# Patient Record
Sex: Male | Born: 1955 | Race: White | Hispanic: No | Marital: Married | Smoking: Never smoker
Health system: Southern US, Community
[De-identification: ages and names within clinical notes are randomized; demographics above are authoritative.]

## PROBLEM LIST (undated history)

## (undated) DIAGNOSIS — N400 Enlarged prostate without lower urinary tract symptoms: Secondary | ICD-10-CM

## (undated) DIAGNOSIS — I10 Essential (primary) hypertension: Secondary | ICD-10-CM

## (undated) DIAGNOSIS — F329 Major depressive disorder, single episode, unspecified: Secondary | ICD-10-CM

## (undated) DIAGNOSIS — E119 Type 2 diabetes mellitus without complications: Secondary | ICD-10-CM

## (undated) DIAGNOSIS — E785 Hyperlipidemia, unspecified: Secondary | ICD-10-CM

## (undated) DIAGNOSIS — R569 Unspecified convulsions: Secondary | ICD-10-CM

## (undated) DIAGNOSIS — G4730 Sleep apnea, unspecified: Secondary | ICD-10-CM

## (undated) DIAGNOSIS — K219 Gastro-esophageal reflux disease without esophagitis: Secondary | ICD-10-CM

## (undated) DIAGNOSIS — F32A Depression, unspecified: Secondary | ICD-10-CM

## (undated) DIAGNOSIS — M1990 Unspecified osteoarthritis, unspecified site: Secondary | ICD-10-CM

## (undated) HISTORY — PX: COLONOSCOPY: SHX174

## (undated) HISTORY — PX: ANKLE RECONSTRUCTION: SHX1151

---

## 2017-06-17 ENCOUNTER — Other Ambulatory Visit: Payer: Self-pay

## 2017-06-17 ENCOUNTER — Encounter (HOSPITAL_BASED_OUTPATIENT_CLINIC_OR_DEPARTMENT_OTHER): Payer: Self-pay | Admitting: *Deleted

## 2017-06-18 ENCOUNTER — Encounter (HOSPITAL_BASED_OUTPATIENT_CLINIC_OR_DEPARTMENT_OTHER)
Admission: RE | Admit: 2017-06-18 | Discharge: 2017-06-18 | Disposition: A | Discharge status: Home / Self Care | Payer: Worker's Compensation | Source: Ambulatory Visit | Attending: Orthopedic Surgery | Admitting: Orthopedic Surgery

## 2017-06-18 DIAGNOSIS — Z01810 Encounter for preprocedural cardiovascular examination: Secondary | ICD-10-CM | POA: Insufficient documentation

## 2017-06-18 DIAGNOSIS — G4730 Sleep apnea, unspecified: Secondary | ICD-10-CM | POA: Diagnosis not present

## 2017-06-18 DIAGNOSIS — R569 Unspecified convulsions: Secondary | ICD-10-CM | POA: Diagnosis not present

## 2017-06-18 DIAGNOSIS — Z01812 Encounter for preprocedural laboratory examination: Secondary | ICD-10-CM | POA: Diagnosis not present

## 2017-06-18 DIAGNOSIS — M25571 Pain in right ankle and joints of right foot: Secondary | ICD-10-CM | POA: Diagnosis present

## 2017-06-18 DIAGNOSIS — I10 Essential (primary) hypertension: Secondary | ICD-10-CM | POA: Diagnosis not present

## 2017-06-18 DIAGNOSIS — M19071 Primary osteoarthritis, right ankle and foot: Secondary | ICD-10-CM | POA: Diagnosis not present

## 2017-06-18 DIAGNOSIS — N400 Enlarged prostate without lower urinary tract symptoms: Secondary | ICD-10-CM | POA: Diagnosis not present

## 2017-06-18 DIAGNOSIS — F329 Major depressive disorder, single episode, unspecified: Secondary | ICD-10-CM | POA: Diagnosis not present

## 2017-06-18 DIAGNOSIS — E785 Hyperlipidemia, unspecified: Secondary | ICD-10-CM | POA: Diagnosis not present

## 2017-06-18 DIAGNOSIS — Z79899 Other long term (current) drug therapy: Secondary | ICD-10-CM | POA: Diagnosis not present

## 2017-06-18 DIAGNOSIS — E119 Type 2 diabetes mellitus without complications: Secondary | ICD-10-CM | POA: Diagnosis not present

## 2017-06-18 DIAGNOSIS — Z7984 Long term (current) use of oral hypoglycemic drugs: Secondary | ICD-10-CM | POA: Diagnosis not present

## 2017-06-18 DIAGNOSIS — M659 Synovitis and tenosynovitis, unspecified: Secondary | ICD-10-CM | POA: Diagnosis not present

## 2017-06-18 DIAGNOSIS — K219 Gastro-esophageal reflux disease without esophagitis: Secondary | ICD-10-CM | POA: Diagnosis not present

## 2017-06-18 LAB — BASIC METABOLIC PANEL
ANION GAP: 9 (ref 5–15)
BUN: 14 mg/dL (ref 6–20)
CALCIUM: 8.9 mg/dL (ref 8.9–10.3)
CO2: 25 mmol/L (ref 22–32)
Chloride: 106 mmol/L (ref 101–111)
Creatinine, Ser: 0.72 mg/dL (ref 0.61–1.24)
GFR calc Af Amer: >60 mL/min (ref >60)
GLUCOSE: 98 mg/dL (ref 65–99)
POTASSIUM: 4.2 mmol/L (ref 3.5–5.1)
SODIUM: 140 mmol/L (ref 135–145)

## 2017-06-18 LAB — SURGICAL PCR SCREEN
MRSA, PCR: NEGATIVE
STAPHYLOCOCCUS AUREUS: NEGATIVE

## 2017-06-18 NOTE — Progress Notes (Signed)
Pt  notified PCR- negative.

## 2017-06-19 ENCOUNTER — Other Ambulatory Visit: Payer: Self-pay | Admitting: Orthopedic Surgery

## 2017-06-27 ENCOUNTER — Other Ambulatory Visit: Payer: Self-pay

## 2017-06-27 ENCOUNTER — Ambulatory Visit (HOSPITAL_BASED_OUTPATIENT_CLINIC_OR_DEPARTMENT_OTHER): Payer: Worker's Compensation | Admitting: Certified Registered"

## 2017-06-27 ENCOUNTER — Ambulatory Visit (HOSPITAL_COMMUNITY): Payer: Worker's Compensation

## 2017-06-27 ENCOUNTER — Encounter (HOSPITAL_BASED_OUTPATIENT_CLINIC_OR_DEPARTMENT_OTHER)
Admission: RE | Disposition: A | Discharge status: Home / Self Care | Payer: Self-pay | Source: Ambulatory Visit | Attending: Orthopedic Surgery

## 2017-06-27 ENCOUNTER — Ambulatory Visit (HOSPITAL_BASED_OUTPATIENT_CLINIC_OR_DEPARTMENT_OTHER)
Admission: RE | Admit: 2017-06-27 | Discharge: 2017-06-28 | Disposition: A | Discharge status: Home / Self Care | Payer: Worker's Compensation | Source: Ambulatory Visit | Attending: Orthopedic Surgery | Admitting: Orthopedic Surgery

## 2017-06-27 ENCOUNTER — Encounter (HOSPITAL_BASED_OUTPATIENT_CLINIC_OR_DEPARTMENT_OTHER): Payer: Self-pay

## 2017-06-27 DIAGNOSIS — E119 Type 2 diabetes mellitus without complications: Secondary | ICD-10-CM | POA: Insufficient documentation

## 2017-06-27 DIAGNOSIS — M19071 Primary osteoarthritis, right ankle and foot: Secondary | ICD-10-CM | POA: Insufficient documentation

## 2017-06-27 DIAGNOSIS — M659 Synovitis and tenosynovitis, unspecified: Secondary | ICD-10-CM | POA: Insufficient documentation

## 2017-06-27 DIAGNOSIS — F329 Major depressive disorder, single episode, unspecified: Secondary | ICD-10-CM | POA: Insufficient documentation

## 2017-06-27 DIAGNOSIS — I10 Essential (primary) hypertension: Secondary | ICD-10-CM | POA: Insufficient documentation

## 2017-06-27 DIAGNOSIS — G4730 Sleep apnea, unspecified: Secondary | ICD-10-CM | POA: Insufficient documentation

## 2017-06-27 DIAGNOSIS — N400 Enlarged prostate without lower urinary tract symptoms: Secondary | ICD-10-CM | POA: Insufficient documentation

## 2017-06-27 DIAGNOSIS — Z79899 Other long term (current) drug therapy: Secondary | ICD-10-CM | POA: Insufficient documentation

## 2017-06-27 DIAGNOSIS — R569 Unspecified convulsions: Secondary | ICD-10-CM | POA: Insufficient documentation

## 2017-06-27 DIAGNOSIS — Z7984 Long term (current) use of oral hypoglycemic drugs: Secondary | ICD-10-CM | POA: Insufficient documentation

## 2017-06-27 DIAGNOSIS — K219 Gastro-esophageal reflux disease without esophagitis: Secondary | ICD-10-CM | POA: Insufficient documentation

## 2017-06-27 DIAGNOSIS — E785 Hyperlipidemia, unspecified: Secondary | ICD-10-CM | POA: Insufficient documentation

## 2017-06-27 DIAGNOSIS — M19171 Post-traumatic osteoarthritis, right ankle and foot: Secondary | ICD-10-CM | POA: Diagnosis present

## 2017-06-27 HISTORY — DX: Hyperlipidemia, unspecified: E78.5

## 2017-06-27 HISTORY — DX: Depression, unspecified: F32.A

## 2017-06-27 HISTORY — DX: Gastro-esophageal reflux disease without esophagitis: K21.9

## 2017-06-27 HISTORY — DX: Type 2 diabetes mellitus without complications: E11.9

## 2017-06-27 HISTORY — DX: Major depressive disorder, single episode, unspecified: F32.9

## 2017-06-27 HISTORY — DX: Benign prostatic hyperplasia without lower urinary tract symptoms: N40.0

## 2017-06-27 HISTORY — DX: Essential (primary) hypertension: I10

## 2017-06-27 HISTORY — DX: Unspecified osteoarthritis, unspecified site: M19.90

## 2017-06-27 HISTORY — DX: Sleep apnea, unspecified: G47.30

## 2017-06-27 HISTORY — DX: Unspecified convulsions: R56.9

## 2017-06-27 HISTORY — PX: TOTAL ANKLE ARTHROPLASTY: SHX811

## 2017-06-27 LAB — GLUCOSE, CAPILLARY
Glucose-Capillary: 119 mg/dL — ABNORMAL HIGH (ref 65–99)
Glucose-Capillary: 151 mg/dL — ABNORMAL HIGH (ref 65–99)
Glucose-Capillary: 60 mg/dL — ABNORMAL LOW (ref 65–99)
Glucose-Capillary: 130 mg/dL — ABNORMAL HIGH (ref 65–99)

## 2017-06-27 SURGERY — ARTHROPLASTY, ANKLE, TOTAL
Anesthesia: General | Site: Ankle | Laterality: Right

## 2017-06-27 MED ORDER — ASPIRIN EC 81 MG PO TBEC: 81.0000 mg | DELAYED_RELEASE_TABLET | Freq: Two times a day (BID) | ORAL | 0 refills | Status: AC

## 2017-06-27 MED ORDER — CLONIDINE HCL (ANALGESIA) 100 MCG/ML EP SOLN
EPIDURAL | Status: DC | PRN
  Administered 2017-06-27: 100 ug

## 2017-06-27 MED ORDER — LACTATED RINGERS IV SOLN: INTRAVENOUS | Status: DC

## 2017-06-27 MED ORDER — PROPOFOL 10 MG/ML IV BOLUS
INTRAVENOUS | Status: DC | PRN
  Administered 2017-06-27: 150 mg via INTRAVENOUS

## 2017-06-27 MED ORDER — SENNA 8.6 MG PO TABS: 1.0000 | ORAL_TABLET | Freq: Two times a day (BID) | ORAL | Status: DC

## 2017-06-27 MED ORDER — DEXAMETHASONE SODIUM PHOSPHATE 10 MG/ML IJ SOLN
INTRAMUSCULAR | Status: DC | PRN
  Administered 2017-06-27: 6 mg via INTRAVENOUS

## 2017-06-27 MED ORDER — METHOCARBAMOL 1000 MG/10ML IJ SOLN: 500.0000 mg | Freq: Four times a day (QID) | INTRAVENOUS | Status: DC | PRN

## 2017-06-27 MED ORDER — DIAZEPAM 2 MG PO TABS
2.0000 mg | ORAL_TABLET | Freq: Three times a day (TID) | ORAL | Status: DC | PRN
  Administered 2017-06-27 – 2017-06-28 (×3): 2 mg via ORAL
  Filled 2017-06-27 (×3): qty 1

## 2017-06-27 MED ORDER — ONDANSETRON HCL 4 MG/2ML IJ SOLN
INTRAMUSCULAR | Status: DC | PRN
  Administered 2017-06-27: 4 mg via INTRAVENOUS

## 2017-06-27 MED ORDER — FENTANYL CITRATE (PF) 100 MCG/2ML IJ SOLN
50.0000 ug | INTRAMUSCULAR | Status: DC | PRN
  Administered 2017-06-27: 100 ug via INTRAVENOUS

## 2017-06-27 MED ORDER — SODIUM CHLORIDE 0.9 % IV SOLN: INTRAVENOUS | Status: DC

## 2017-06-27 MED ORDER — FENTANYL CITRATE (PF) 100 MCG/2ML IJ SOLN
INTRAMUSCULAR | Status: AC
  Filled 2017-06-27: qty 2

## 2017-06-27 MED ORDER — HYDROMORPHONE HCL 1 MG/ML IJ SOLN: 0.5000 mg | INTRAMUSCULAR | Status: DC | PRN

## 2017-06-27 MED ORDER — OXYCODONE HCL 5 MG PO TABS
5.0000 mg | ORAL_TABLET | ORAL | Status: DC | PRN
  Administered 2017-06-28: 10 mg via ORAL
  Filled 2017-06-27 (×2): qty 2

## 2017-06-27 MED ORDER — VANCOMYCIN HCL 500 MG IV SOLR
INTRAVENOUS | Status: DC | PRN
  Administered 2017-06-27: 500 mg via TOPICAL

## 2017-06-27 MED ORDER — DIAZEPAM 2 MG PO TABS: 2.0000 mg | ORAL_TABLET | Freq: Three times a day (TID) | ORAL | 0 refills | Status: AC | PRN

## 2017-06-27 MED ORDER — SENNA 8.6 MG PO TABS: 2.0000 | ORAL_TABLET | Freq: Two times a day (BID) | ORAL | 0 refills | Status: AC

## 2017-06-27 MED ORDER — SCOPOLAMINE 1 MG/3DAYS TD PT72: 1.0000 | MEDICATED_PATCH | Freq: Once | TRANSDERMAL | Status: DC | PRN

## 2017-06-27 MED ORDER — CHLORHEXIDINE GLUCONATE 4 % EX LIQD: 60.0000 mL | Freq: Once | CUTANEOUS | Status: DC

## 2017-06-27 MED ORDER — MEPERIDINE HCL 25 MG/ML IJ SOLN: 6.2500 mg | INTRAMUSCULAR | Status: DC | PRN

## 2017-06-27 MED ORDER — CEFAZOLIN SODIUM-DEXTROSE 2-4 GM/100ML-% IV SOLN
INTRAVENOUS | Status: AC
  Filled 2017-06-27: qty 100

## 2017-06-27 MED ORDER — METOCLOPRAMIDE HCL 5 MG/ML IJ SOLN: 10.0000 mg | Freq: Once | INTRAMUSCULAR | Status: DC | PRN

## 2017-06-27 MED ORDER — OXYCODONE HCL 5 MG PO TABS: 5.0000 mg | ORAL_TABLET | ORAL | 0 refills | Status: AC | PRN

## 2017-06-27 MED ORDER — MIDAZOLAM HCL 2 MG/2ML IJ SOLN
INTRAMUSCULAR | Status: AC
  Filled 2017-06-27: qty 2

## 2017-06-27 MED ORDER — 0.9 % SODIUM CHLORIDE (POUR BTL) OPTIME
TOPICAL | Status: DC | PRN
  Administered 2017-06-27: 1500 mL

## 2017-06-27 MED ORDER — MIDAZOLAM HCL 2 MG/2ML IJ SOLN
1.0000 mg | INTRAMUSCULAR | Status: DC | PRN
  Administered 2017-06-27: 2 mg via INTRAVENOUS

## 2017-06-27 MED ORDER — FENTANYL CITRATE (PF) 100 MCG/2ML IJ SOLN: 25.0000 ug | INTRAMUSCULAR | Status: DC | PRN

## 2017-06-27 MED ORDER — METHOCARBAMOL 500 MG PO TABS
500.0000 mg | ORAL_TABLET | Freq: Four times a day (QID) | ORAL | Status: DC | PRN
  Administered 2017-06-27 – 2017-06-28 (×2): 500 mg via ORAL
  Filled 2017-06-27 (×2): qty 1

## 2017-06-27 MED ORDER — CEFAZOLIN SODIUM-DEXTROSE 2-4 GM/100ML-% IV SOLN
2.0000 g | INTRAVENOUS | Status: AC
  Administered 2017-06-27: 2 g via INTRAVENOUS

## 2017-06-27 MED ORDER — LACTATED RINGERS IV SOLN
INTRAVENOUS | Status: DC
  Administered 2017-06-27 (×2): via INTRAVENOUS

## 2017-06-27 MED ORDER — DOCUSATE SODIUM 100 MG PO CAPS: 100.0000 mg | ORAL_CAPSULE | Freq: Two times a day (BID) | ORAL | Status: DC

## 2017-06-27 MED ORDER — SODIUM CHLORIDE 0.9 % IV SOLN
INTRAVENOUS | Status: DC
  Administered 2017-06-27: 11:00:00 via INTRAVENOUS

## 2017-06-27 MED ORDER — DOCUSATE SODIUM 100 MG PO CAPS: 100.0000 mg | ORAL_CAPSULE | Freq: Two times a day (BID) | ORAL | 0 refills | Status: AC

## 2017-06-27 MED ORDER — LIDOCAINE HCL (CARDIAC) 20 MG/ML IV SOLN
INTRAVENOUS | Status: DC | PRN
  Administered 2017-06-27: 30 mg via INTRAVENOUS

## 2017-06-27 MED ORDER — ROPIVACAINE HCL 5 MG/ML IJ SOLN
INTRAMUSCULAR | Status: DC | PRN
  Administered 2017-06-27 (×2): 20 mL via EPIDURAL

## 2017-06-27 SURGICAL SUPPLY — 79 items
BAG DECANTER FOR FLEXI CONT (MISCELLANEOUS) ×3 IMPLANT
BANDAGE ACE 4X5 VEL STRL LF (GAUZE/BANDAGES/DRESSINGS) ×3 IMPLANT
BANDAGE ACE 6X5 VEL STRL LF (GAUZE/BANDAGES/DRESSINGS) IMPLANT
BANDAGE ESMARK 6X9 LF (GAUZE/BANDAGES/DRESSINGS) IMPLANT
BLADE OSC (BLADE) ×3 IMPLANT
BLADE RECIP (BLADE) ×3 IMPLANT
BLADE RECIPRO TAPERED (BLADE) ×3 IMPLANT
BLADE SURG 15 STRL LF DISP TIS (BLADE) ×3 IMPLANT
BLADE SURG 15 STRL SS (BLADE) ×6
BNDG COHESIVE 4X5 TAN STRL (GAUZE/BANDAGES/DRESSINGS) IMPLANT
BNDG COHESIVE 6X5 TAN STRL LF (GAUZE/BANDAGES/DRESSINGS) IMPLANT
BNDG ESMARK 6X9 LF (GAUZE/BANDAGES/DRESSINGS)
BNDG GAUZE ELAST 4 BULKY (GAUZE/BANDAGES/DRESSINGS) IMPLANT
CHLORAPREP W/TINT 26ML (MISCELLANEOUS) ×6 IMPLANT
CORE SLIDING POLY ANKLE 9MM (Orthopedic Implant) ×3 IMPLANT
COVER BACK TABLE 60X90IN (DRAPES) ×6 IMPLANT
CUFF TOURNIQUET SINGLE 34IN LL (TOURNIQUET CUFF) ×3 IMPLANT
DECANTER SPIKE VIAL GLASS SM (MISCELLANEOUS) IMPLANT
DISPOSABLES PACK SBI (PACKS) ×3 IMPLANT
DRAPE C-ARM 42X72 X-RAY (DRAPES) ×3 IMPLANT
DRAPE C-ARMOR (DRAPES) ×3 IMPLANT
DRAPE EXTREMITY T 121X128X90 (DRAPE) ×3 IMPLANT
DRAPE IMP U-DRAPE 54X76 (DRAPES) IMPLANT
DRAPE U-SHAPE 47X51 STRL (DRAPES) ×3 IMPLANT
DRSG MEPILEX BORDER 4X8 (GAUZE/BANDAGES/DRESSINGS) ×3 IMPLANT
DRSG MEPITEL 4X7.2 (GAUZE/BANDAGES/DRESSINGS) ×3 IMPLANT
DRSG PAD ABDOMINAL 8X10 ST (GAUZE/BANDAGES/DRESSINGS) ×6 IMPLANT
GAUZE SPONGE 4X4 12PLY STRL (GAUZE/BANDAGES/DRESSINGS) ×3 IMPLANT
GLOVE BIO SURGEON STRL SZ8 (GLOVE) ×3 IMPLANT
GLOVE BIOGEL PI IND STRL 7.0 (GLOVE) ×2 IMPLANT
GLOVE BIOGEL PI IND STRL 8 (GLOVE) ×2 IMPLANT
GLOVE BIOGEL PI INDICATOR 7.0 (GLOVE) ×4
GLOVE BIOGEL PI INDICATOR 8 (GLOVE) ×4
GLOVE ECLIPSE 6.5 STRL STRAW (GLOVE) ×3 IMPLANT
GLOVE ECLIPSE 8.0 STRL XLNG CF (GLOVE) ×3 IMPLANT
GOWN STRL REUS W/ TWL LRG LVL3 (GOWN DISPOSABLE) ×1 IMPLANT
GOWN STRL REUS W/ TWL XL LVL3 (GOWN DISPOSABLE) ×1 IMPLANT
GOWN STRL REUS W/TWL LRG LVL3 (GOWN DISPOSABLE) ×2
GOWN STRL REUS W/TWL XL LVL3 (GOWN DISPOSABLE) ×5 IMPLANT
IMPLANT TALAR STAR SZ S RT (Orthopedic Implant) ×3 IMPLANT
IMPLANT TIBIAL STAR SZ L (Orthopedic Implant) ×3 IMPLANT
NEEDLE HYPO 22GX1.5 SAFETY (NEEDLE) IMPLANT
NS IRRIG 1000ML POUR BTL (IV SOLUTION) ×3 IMPLANT
PACK BASIN DAY SURGERY FS (CUSTOM PROCEDURE TRAY) ×3 IMPLANT
PAD CAST 4YDX4 CTTN HI CHSV (CAST SUPPLIES) ×2 IMPLANT
PADDING CAST ABS 4INX4YD NS (CAST SUPPLIES)
PADDING CAST ABS COTTON 4X4 ST (CAST SUPPLIES) IMPLANT
PADDING CAST COTTON 4X4 STRL (CAST SUPPLIES) ×4
PADDING CAST COTTON 6X4 STRL (CAST SUPPLIES) ×3 IMPLANT
PADDING CAST SYNTHETIC 4 (CAST SUPPLIES)
PADDING CAST SYNTHETIC 4X4 STR (CAST SUPPLIES) IMPLANT
PENCIL BUTTON HOLSTER BLD 10FT (ELECTRODE) ×3 IMPLANT
SANITIZER HAND PURELL 535ML FO (MISCELLANEOUS) ×3 IMPLANT
SCOTCHCAST PLUS 4X4 WHITE (CAST SUPPLIES) IMPLANT
SHEET MEDIUM DRAPE 40X70 STRL (DRAPES) ×3 IMPLANT
SLEEVE SCD COMPRESS KNEE MED (MISCELLANEOUS) ×3 IMPLANT
SPLINT FAST PLASTER 5X30 (CAST SUPPLIES)
SPLINT PLASTER CAST FAST 5X30 (CAST SUPPLIES) IMPLANT
STAPLER VISISTAT 35W (STAPLE) IMPLANT
STOCKINETTE 6  STRL (DRAPES) ×2
STOCKINETTE 6 STRL (DRAPES) ×1 IMPLANT
SUCTION FRAZIER HANDLE 10FR (MISCELLANEOUS) ×2
SUCTION TUBE FRAZIER 10FR DISP (MISCELLANEOUS) ×1 IMPLANT
SURGILUBE 2OZ TUBE FLIPTOP (MISCELLANEOUS) ×3 IMPLANT
SUT ETHILON 3 0 PS 1 (SUTURE) ×6 IMPLANT
SUT MNCRL AB 3-0 PS2 18 (SUTURE) ×6 IMPLANT
SUT VIC AB 0 CT1 27 (SUTURE) ×2
SUT VIC AB 0 CT1 27XBRD ANBCTR (SUTURE) ×1 IMPLANT
SUT VIC AB 0 SH 27 (SUTURE) IMPLANT
SUT VIC AB 2-0 SH 27 (SUTURE)
SUT VIC AB 2-0 SH 27XBRD (SUTURE) IMPLANT
SYR 20CC LL (SYRINGE) IMPLANT
SYR BULB 3OZ (MISCELLANEOUS) IMPLANT
SYR BULB IRRIGATION 50ML (SYRINGE) ×3 IMPLANT
TOWEL OR 17X24 6PK STRL BLUE (TOWEL DISPOSABLE) ×3 IMPLANT
TUBE CONNECTING 20'X1/4 (TUBING) ×1
TUBE CONNECTING 20X1/4 (TUBING) ×2 IMPLANT
UNDERPAD 30X30 (UNDERPADS AND DIAPERS) ×3 IMPLANT
YANKAUER SUCT BULB TIP NO VENT (SUCTIONS) ×3 IMPLANT

## 2017-06-27 NOTE — Op Note (Signed)
06/27/2017  9:40 AM  PATIENT:  Steven Rios  62 y.o. male  PRE-OPERATIVE DIAGNOSIS:  Right ankle post-traumatic arthritis  POST-OPERATIVE DIAGNOSIS:  Right ankle post-traumatic arthritis  Procedure(s): Right total ankle replacement  SURGEON:  Toni Arthurs, MD  ASSISTANT: Alfredo Martinez, PA-C  ANESTHESIA:   General, regional  EBL:  minimal   TOURNIQUET:   Total Tourniquet Time Documented: Thigh (Right) - 101 minutes Total: Thigh (Right) - 101 minutes  COMPLICATIONS:  None apparent  DISPOSITION:  Extubated, awake and stable to recovery.  INDICATION FOR PROCEDURE: The patient is a 62 year old male with a history of posttraumatic right ankle arthritis.  He has failed nonoperative treatment for this painful condition and presents today for right total ankle replacement.  The risks and benefits of the alternative treatment options have been discussed in detail.  The patient wishes to proceed with surgery and specifically understands risks of bleeding, infection, nerve damage, blood clots, need for additional surgery, amputation and death.  PROCEDURE IN DETAIL:  After pre operative consent was obtained, and the correct operative site was identified, the patient was brought to the operating room and placed supine on the OR table.  Anesthesia was administered.  Pre-operative antibiotics were administered.  A surgical timeout was taken.  The right lower extremity was prepped and draped in standard sterile fashion with a tourniquet around the thigh.  The extremity was elevated and the tourniquet was inflated to 250 mmHg.  An anterior incision was made centered over the ankle.  Dissection was carried down through the subcutaneous tissues to the extensor retinaculum.  The retinaculum was incised over the EHL tendon and released proximally and distally.  The interval between the tibialis anterior and the EHL was then developed.  The neurovascular bundle was identified.  It was mobilized and retracted  laterally.  It was protected throughout the case.  The anterior ankle joint capsule was incised and elevated medially and laterally.  Synovitis was excised allowing adequate exposure.  An osteotome was placed in the medial gutter.  The external tibial alignment guide was attached with a guide pin placed at the tibial tubercle.  Rotation of the guide was set in line with the osteotome.  The anterior and lateral radiographs were then used to align the guide with the tibia in both planes.  The cutting guide was pinned distally.  The resection height was then set and the cutting block pinned as well.  The medial and lateral malleolus were protected and an oscillating saw was used to cut the distal tibia.  Cut bone was removed.  The talar cutting block was applied.  A lateral radiograph was used to confirm appropriate alignment.  It was pinned in place.  The dome of the talus was then cut with the oscillating saw and cut bone removed.  The alignment guide was removed.  The talus was sized as a small.  The data was appropriately positioned using fluoroscopic guidance and pinned in place.  The anterior posterior cutting guide was attached and pinned.  The cuts were made and cut bone removed.  The medial lateral cutting guide was then attached and medial and lateral cuts made.  Cut bone was removed.  The window trial was applied was noted to fit appropriately.  It was pinned in place.  The keel hole was cut after drilling.  The window trial was removed.  The wound was irrigated copiously and Vanco mycin powder sprinkled over the talus.  A size small talar component was inserted and  impacted in position.  The distal tibia was measured.  A size large tibial trial was inserted.  AP and lateral radiographs confirmed appropriate position.  The trial was pinned and then the barrel holes drilled and punched.  The wound was again irrigated copiously.  The size large tibial component was inserted and impacted in position.  A trial  poly-was inserted.  AP and lateral radiographs showed appropriate position of the hardware.  The deep and superficial fibers of the deltoid ligament were released to allow appropriate ligament balance medially and laterally.  The trial poly-was removed.  The wound was again irrigated.  Vanco myosin powder was sprinkled in the deep portion of the wound.  A size 9 mm poly-spacer was inserted.  Final AP, mortise and lateral radiographs confirmed appropriate position and alignment of all hardware.  The wound was again irrigated.  The anterior barrel holes were bone grafted.  Vancomycin powder was sprinkled over the anterior portion of the tibia.  The joint capsule was repaired with 0 Vicryl.  The extensor retinaculum was repaired with 0 Vicryl.  The subtenons tissues were approximated with inverted simple sutures of 3-0 Monocryl.  The skin was closed with a running 3-0 nylon.  The proximal incision was closed with nylon after removing the guide pin and irrigating.  The wounds were dressed sterilely and a short leg well-padded fiberglass cast was applied.  Tourniquet was released after application of the dressings.  Patient was awakened from anesthesia and transported to the recovery room in stable condition.  FOLLOW UP PLAN: The patient will be nonweightbearing on the right lower extremity.  He will be observed overnight for pain control.  Aspirin 81 mg p.o. twice daily for DVT prophylax.  Follow up in 3 weeks for suture removal.  Depending on the condition of the wound will consider transition to a Cam Walker boot and use of compression stocking at that time.    Alfredo MartinezJustin Ollis PA-C was present and scrubbed for the duration of the operative case. His assistance was essential in positioning the patient, prepping and draping, gaining and maintaining exposure, performing the operation, closing and dressing the wounds and applying the cast.

## 2017-06-27 NOTE — Anesthesia Procedure Notes (Signed)
Procedure Name: LMA Insertion Date/Time: 06/27/2017 7:32 AM Performed by: Sheryn BisonBlocker, Becket Wecker D, CRNA Pre-anesthesia Checklist: Patient identified, Emergency Drugs available, Suction available and Patient being monitored Patient Re-evaluated:Patient Re-evaluated prior to induction Oxygen Delivery Method: Circle system utilized Preoxygenation: Pre-oxygenation with 100% oxygen Induction Type: IV induction Ventilation: Mask ventilation without difficulty LMA: LMA inserted LMA Size: 4.0 Number of attempts: 1 Airway Equipment and Method: Bite block Placement Confirmation: positive ETCO2 Tube secured with: Tape Dental Injury: Teeth and Oropharynx as per pre-operative assessment

## 2017-06-27 NOTE — Discharge Instructions (Addendum)
Toni Arthurs, MD [facility]  Please read the following information regarding your care after surgery.  Medications  You only need a prescription for the narcotic pain medicine (ex. oxycodone, Percocet, Norco).  All of the other medicines listed below are available over the counter. X Aleve 2 pills twice a day for the first 3 days after surgery. X acetominophen (Tylenol) 650 mg every 4-6 hours as you need for minor to moderate pain X oxycodone as prescribed for severe pain  Narcotic pain medicine (ex. oxycodone, Percocet, Vicodin) will cause constipation.  To prevent this problem, take the following medicines while you are taking any pain medicine. X docusate sodium (Colace) 100 mg twice a day X senna (Senokot) 2 tablets twice a day  X To help prevent blood clots, take a baby aspirin (81 mg) twice a day for three weeks after surgery.  You should also get up every hour while you are awake to move around.    Weight Bearing X Do not bear any weight on the operated leg or foot.  Cast / Splint / Dressing X Keep your splint, cast or dressing clean and dry.  Dont put anything (coat hanger, pencil, etc) down inside of it.  If it gets damp, use a hair dryer on the cool setting to dry it.  If it gets soaked, call the office to schedule an appointment for a cast change.     After your dressing, cast or splint is removed; you may shower, but do not soak or scrub the wound.  Allow the water to run over it, and then gently pat it dry.  Swelling It is normal for you to have swelling where you had surgery.  To reduce swelling and pain, keep your toes above your nose for at least 3 days after surgery.  It may be necessary to keep your foot or leg elevated for several weeks.  If it hurts, it should be elevated.  Follow Up Call my office at [phone] when you are discharged from the hospital or surgery center to schedule an appointment to be seen two weeks after surgery.  Call my office at  [phone] if you develop a fever >101.5 F, nausea, vomiting, bleeding from the surgical site or severe pain.     Post Anesthesia Home Care Instructions  Activity: Get plenty of rest for the remainder of the day. A responsible individual must stay with you for 24 hours following the procedure.  For the next 24 hours, DO NOT: -Drive a car -Advertising copywriter -Drink alcoholic beverages -Take any medication unless instructed by your physician -Make any legal decisions or sign important papers.  Meals: Start with liquid foods such as gelatin or soup. Progress to regular foods as tolerated. Avoid greasy, spicy, heavy foods. If nausea and/or vomiting occur, drink only clear liquids until the nausea and/or vomiting subsides. Call your physician if vomiting continues.  Special Instructions/Symptoms: Your throat may feel dry or sore from the anesthesia or the breathing tube placed in your throat during surgery. If this causes discomfort, gargle with warm salt water. The discomfort should disappear within 24 hours.  If you had a scopolamine patch placed behind your ear for the management of post- operative nausea and/or vomiting:  1. The medication in the patch is effective for 72 hours, after which it should be removed.  Wrap patch in a tissue and discard in the trash. Wash hands thoroughly with soap and water. 2. You may remove the patch earlier than 72 hours  if you experience unpleasant side effects which may include dry mouth, dizziness or visual disturbances. 3. Avoid touching the patch. Wash your hands with soap and water after contact with the patch.   Regional Anesthesia Blocks  1. Numbness or the inability to move the "blocked" extremity may last from 3-48 hours after placement. The length of time depends on the medication injected and your individual response to the medication. If the numbness is not going away after 48 hours, call your surgeon.  2. The extremity that is blocked will  need to be protected until the numbness is gone and the  Strength has returned. Because you cannot feel it, you will need to take extra care to avoid injury. Because it may be weak, you may have difficulty moving it or using it. You may not know what position it is in without looking at it while the block is in effect.  3. For blocks in the legs and feet, returning to weight bearing and walking needs to be done carefully. You will need to wait until the numbness is entirely gone and the strength has returned. You should be able to move your leg and foot normally before you try and bear weight or walk. You will need someone to be with you when you first try to ensure you do not fall and possibly risk injury.  4. Bruising and tenderness at the needle site are common side effects and will resolve in a few days.  5. Persistent numbness or new problems with movement should be communicated to the surgeon or the [facility] [phone])/ [facility] [place] [phone]).

## 2017-06-27 NOTE — Progress Notes (Signed)
Assisted Dr. Carignan with right, ultrasound guided, popliteal/saphenous block. Side rails up, monitors on throughout procedure. See vital signs in flow sheet. Tolerated Procedure well. 

## 2017-06-27 NOTE — Anesthesia Procedure Notes (Signed)
Anesthesia Regional Block: Popliteal block   Pre-Anesthetic Checklist: ,, timeout performed, Correct Patient, Correct Site, Correct Laterality, Correct Procedure, Correct Position, site marked, Risks and benefits discussed,  Surgical consent,  Pre-op evaluation,  At surgeon's request and post-op pain management  Laterality: Right and Lower  Prep: Maximum Sterile Barrier Precautions used, chloraprep       Needles:  Injection technique: Single-shot  Needle Type: Echogenic Stimulator Needle     Needle Length: 10cm      Additional Needles:   Procedures:,,,, ultrasound used (permanent image in chart),,,,  Narrative:  Start time: 06/27/2017 7:06 AM End time: 06/27/2017 7:11 AM Injection made incrementally with aspirations every 5 mL.  Performed by: Personally  Anesthesiologist: Phillips Groutarignan, Christian Treadway, MD  Additional Notes: Risks, benefits and alternative to block explained extensively.  Patient tolerated procedure well, without complications.

## 2017-06-27 NOTE — Anesthesia Preprocedure Evaluation (Signed)
Anesthesia Evaluation  Patient identified by MRN, date of birth, ID band Patient awake    Reviewed: Allergy & Precautions, NPO status , Patient's Chart, lab work & pertinent test results  Airway Mallampati: II  TM Distance: >3 FB Neck ROM: Full    Dental no notable dental hx.    Pulmonary sleep apnea ,    Pulmonary exam normal breath sounds clear to auscultation       Cardiovascular hypertension, Pt. on medications Normal cardiovascular exam Rhythm:Regular Rate:Normal     Neuro/Psych Seizures -, Well Controlled,  negative psych ROS   GI/Hepatic negative GI ROS, Neg liver ROS,   Endo/Other  diabetes, Type 2, Oral Hypoglycemic Agents  Renal/GU negative Renal ROS  negative genitourinary   Musculoskeletal negative musculoskeletal ROS (+)   Abdominal   Peds negative pediatric ROS (+)  Hematology negative hematology ROS (+)   Anesthesia Other Findings   Reproductive/Obstetrics negative OB ROS                             Anesthesia Physical Anesthesia Plan  ASA: II  Anesthesia Plan: General   Post-op Pain Management:  Regional for Post-op pain   Induction: Intravenous  PONV Risk Score and Plan: 2 and Ondansetron, Treatment may vary due to age or medical condition and Midazolam  Airway Management Planned: LMA  Additional Equipment:   Intra-op Plan:   Post-operative Plan:   Informed Consent: I have reviewed the patients History and Physical, chart, labs and discussed the procedure including the risks, benefits and alternatives for the proposed anesthesia with the patient or authorized representative who has indicated his/her understanding and acceptance.   Dental advisory given  Plan Discussed with: CRNA  Anesthesia Plan Comments:         Anesthesia Quick Evaluation

## 2017-06-27 NOTE — Transfer of Care (Signed)
Immediate Anesthesia Transfer of Care Note  Patient: Steven Rios  Procedure(s) Performed: RIGHT TOTAL ANKLE ARTHROPLASTY (Right Ankle)  Patient Location: PACU  Anesthesia Type:GA combined with regional for post-op pain  Level of Consciousness: awake and patient cooperative  Airway & Oxygen Therapy: Patient Spontanous Breathing and Patient connected to face mask oxygen  Post-op Assessment: Report given to RN and Post -op Vital signs reviewed and stable  Post vital signs: Reviewed and stable  Last Vitals:  Vitals:   06/27/17 0715 06/27/17 0720  BP: 113/73   Pulse: 72 72  Resp: 17 15  Temp:    SpO2: 100% 97%    Last Pain:  Vitals:   06/27/17 0635  TempSrc: Oral         Complications: No apparent anesthesia complications

## 2017-06-27 NOTE — Anesthesia Procedure Notes (Signed)
Anesthesia Regional Block: Adductor canal block   Pre-Anesthetic Checklist: ,, timeout performed, Correct Patient, Correct Site, Correct Laterality, Correct Procedure, Correct Position, site marked, Risks and benefits discussed,  Surgical consent,  Pre-op evaluation,  At surgeon's request and post-op pain management  Laterality: Right and Lower  Prep: Maximum Sterile Barrier Precautions used, chloraprep       Needles:  Injection technique: Single-shot  Needle Type: Echogenic Stimulator Needle     Needle Length: 10cm      Additional Needles:   Procedures:,,,, ultrasound used (permanent image in chart),,,,  Narrative:  Start time: 06/27/2017 7:00 AM End time: 06/27/2017 7:05 AM Injection made incrementally with aspirations every 5 mL.  Performed by: Personally  Anesthesiologist: Phillips Groutarignan, Juanpablo Ciresi, MD  Additional Notes: Risks, benefits and alternative to block explained extensively.  Patient tolerated procedure well, without complications.

## 2017-06-27 NOTE — H&P (Signed)
Steven Rios is an 62 y.o. male.   Chief Complaint: Right ankle pain HPI: The patient is a 62 year old male without significant past medical history.  He has post traumatic arthritis of his right ankle after a work injury.  He has failed nonoperative treatment to date including activity modification, oral anti-inflammatories, bracing and steroid injection.  He presents now for right total ankle replacement.  Past Medical History:  Diagnosis Date  . Arthritis   . BPH (benign prostatic hyperplasia)   . Depression   . Diabetes mellitus without complication (HCC)   . GERD (gastroesophageal reflux disease)   . Hyperlipidemia   . Hypertension   . Seizures (HCC)    had 1 sz 19 yrs ago and has been on dilantin  . Sleep apnea    does not use CPAP    Past Surgical History:  Procedure Laterality Date  . ANKLE RECONSTRUCTION Left   . COLONOSCOPY      History reviewed. No pertinent family history. Social History:  reports that  has never smoked. he has never used smokeless tobacco. He reports that he drinks alcohol. He reports that he does not use drugs.  Allergies:  Allergies  Allergen Reactions  . Ciprofloxacin Anaphylaxis    Medications Prior to Admission  Medication Sig Dispense Refill  . atorvastatin (LIPITOR) 20 MG tablet Take 20 mg by mouth daily.    Marland Kitchen escitalopram (LEXAPRO) 10 MG tablet Take 10 mg by mouth daily.    . pantoprazole (PROTONIX) 40 MG tablet Take 40 mg by mouth 2 (two) times daily.    . phenytoin (DILANTIN) 300 MG ER capsule Take 600 mg by mouth daily.    . quinapril (ACCUPRIL) 10 MG tablet Take 10 mg by mouth daily.    . sitaGLIPtin-metformin (JANUMET) 50-1000 MG tablet Take 1 tablet by mouth 2 (two) times daily with a meal.    . tamsulosin (FLOMAX) 0.4 MG CAPS capsule Take 0.4 mg by mouth.      Results for orders placed or performed during the hospital encounter of 06/27/17 (from the past 48 hour(s))  Glucose, capillary     Status: Abnormal   Collection  Time: 06/27/17  6:53 AM  Result Value Ref Range   Glucose-Capillary 119 (H) 65 - 99 mg/dL   No results found.  ROS no recent fever, chills, nausea, vomiting or changes in his appetite  Pulse 74, temperature 98.5 F (36.9 C), temperature source Oral, resp. rate 14, height 5\' 8"  (1.727 m), weight 82.6 kg (182 lb), SpO2 99 %. Physical Exam  Well-nourished well-developed man in no apparent distress.  Alert and oriented x4.  Mood and affect are normal.  Extraocular motions are intact.  Respirations are unlabored.  Gait is antalgic to the right.  The right ankle has healthy and intact skin.  Dorsalis pedis and posterior tibial pulses are 2+.  Sensibility to light touch is intact across the dorsum of the foot.  5 out of 5 strength in plantar flexion and dorsiflexion of the ankle.  Tender to palpation at the anteromedial and anterolateral joint line.  Decreased range of motion at the ankle joint.  No lymphadenopathy.  Assessment/Plan Right ankle posttraumatic arthritis -to the operating room today for right total ankle replacement and possible gastrocnemius recession.  The risks and benefits of the alternative treatment options have been discussed in detail.  The patient wishes to proceed with surgery and specifically understands risks of bleeding, infection, nerve damage, blood clots, need for additional surgery, amputation and death.  Toni ArthursHEWITT, Katelynne Revak, MD 06/27/2017, 7:19 AM

## 2017-06-27 NOTE — Anesthesia Postprocedure Evaluation (Signed)
Anesthesia Post Note  Patient: Bradly ChrisSamuel Shakir  Procedure(s) Performed: RIGHT TOTAL ANKLE ARTHROPLASTY (Right Ankle)     Patient location during evaluation: PACU Anesthesia Type: General Level of consciousness: awake and alert Pain management: pain level controlled Vital Signs Assessment: post-procedure vital signs reviewed and stable Respiratory status: spontaneous breathing, nonlabored ventilation, respiratory function stable and patient connected to nasal cannula oxygen Cardiovascular status: blood pressure returned to baseline and stable Postop Assessment: no apparent nausea or vomiting Anesthetic complications: no    Last Vitals:  Vitals:   06/27/17 1330 06/27/17 1432  BP: 135/68 (!) 107/58  Pulse: 87 76  Resp: 16 16  Temp:  (!) 36.1 C  SpO2: 96% 97%    Last Pain:  Vitals:   06/27/17 1432  TempSrc:   PainSc: 0-No pain                 Phillips Groutarignan, Yashas Camilli

## 2017-06-28 ENCOUNTER — Encounter (HOSPITAL_BASED_OUTPATIENT_CLINIC_OR_DEPARTMENT_OTHER): Payer: Self-pay | Admitting: Orthopedic Surgery

## 2017-06-28 DIAGNOSIS — M19071 Primary osteoarthritis, right ankle and foot: Secondary | ICD-10-CM | POA: Diagnosis not present

## 2019-01-24 IMAGING — RF DG C-ARM 61-120 MIN
1 series · 3 of 3 positions shown · non-contrast
Comparison: Right foot films of 12/07/2016

CLINICAL DATA: Posttraumatic right ankle arthritis

EXAM:
DG C-ARM 61-120 MIN

[Series 1: run · 3 of 3 slices shown]
[im 1/3]
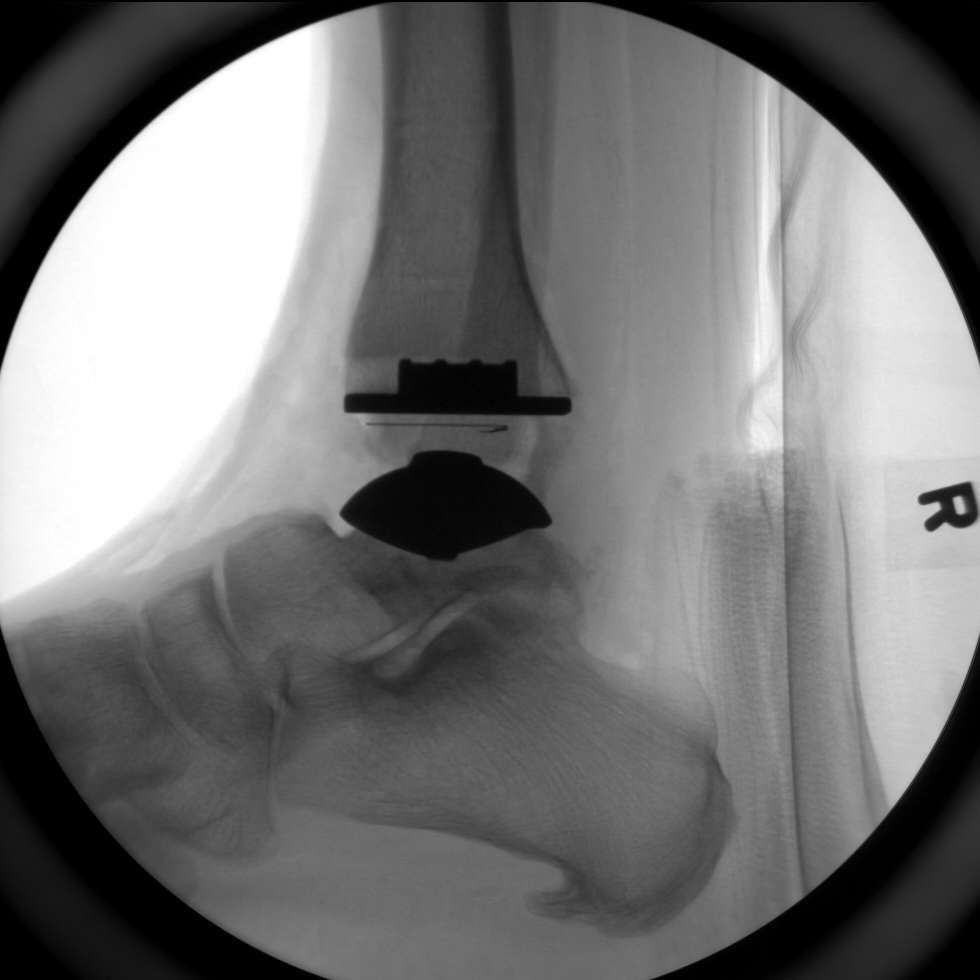
[im 2/3]
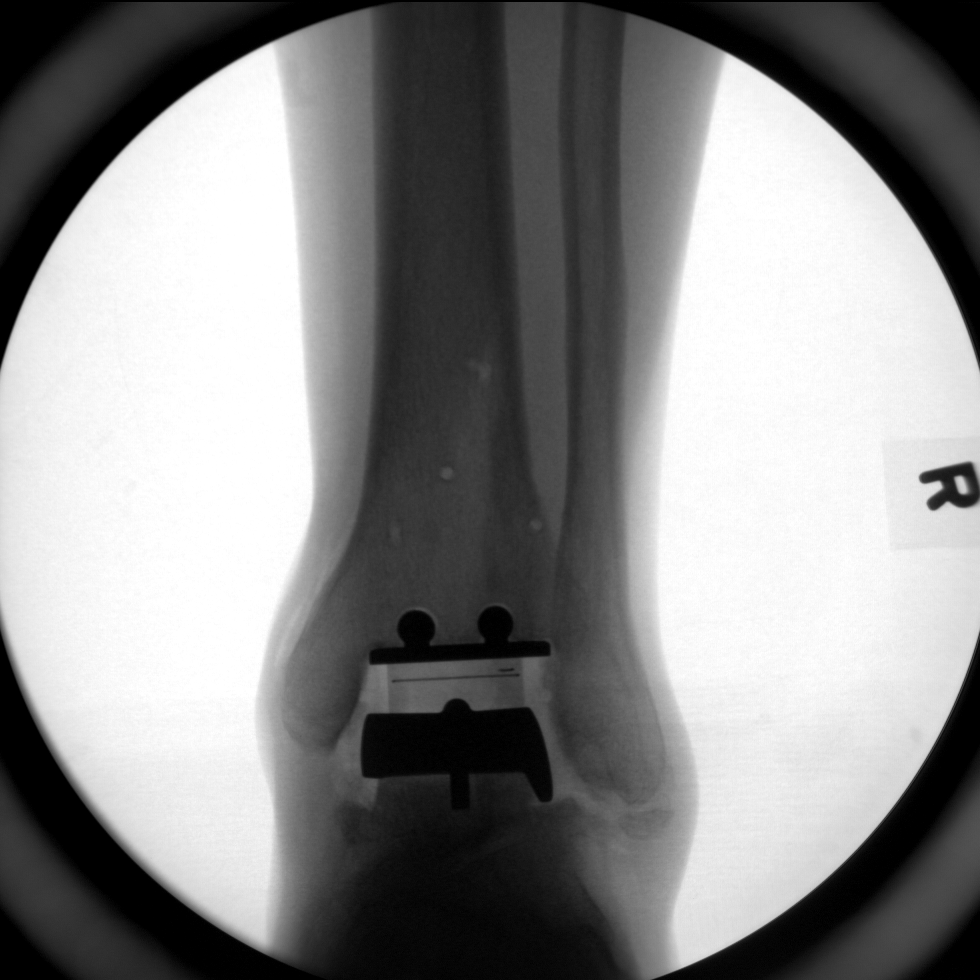
[im 3/3]
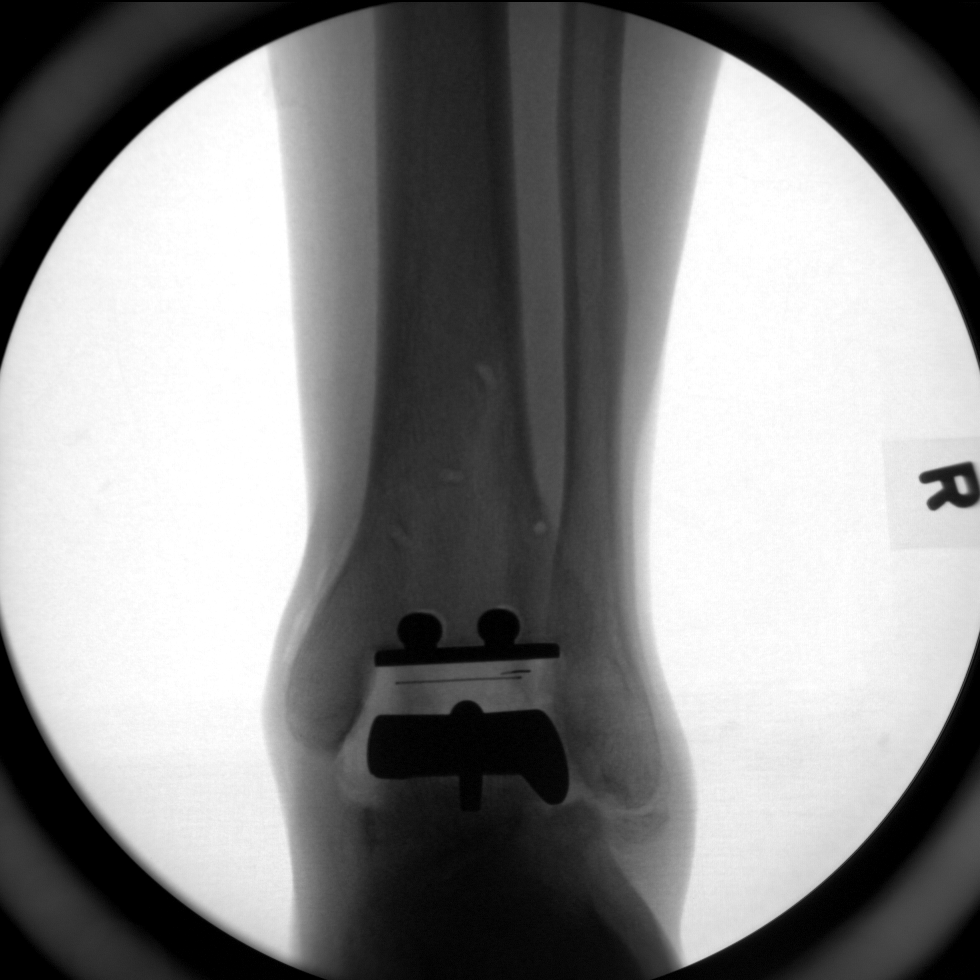

[3 of 3 positions shown; findings below may reference images not displayed]

FINDINGS: Three C-arm spot films were returned. Fluoroscopy time of 41 seconds
was recorded.
IMPRESSION: C-arm fluoroscopy performed during right ankle arthroplasty.
